# Patient Record
Sex: Female | Born: 1975 | Hispanic: Yes | Marital: Married
Health system: Midwestern US, Academic
[De-identification: ages and names within clinical notes are randomized; demographics above are authoritative.]

---

## 2021-04-29 ENCOUNTER — Encounter: Admit: 2021-04-29 | Discharge: 2021-04-29

## 2021-04-29 ENCOUNTER — Emergency Department: Admit: 2021-04-29 | Discharge: 2021-04-29

## 2021-04-29 DIAGNOSIS — K219 Gastro-esophageal reflux disease without esophagitis: Secondary | ICD-10-CM

## 2021-04-29 DIAGNOSIS — R1084 Generalized abdominal pain: Secondary | ICD-10-CM

## 2021-04-29 LAB — COMPREHENSIVE METABOLIC PANEL
ALBUMIN: 4.4 g/dL (ref 3.5–5.0)
BLD UREA NITROGEN: 12 mg/dL (ref 7–25)
CO2: 26 MMOL/L (ref 21–30)
EGFR: >60 mL/min (ref >60)
POTASSIUM: 4.1 MMOL/L (ref 3.5–5.1)
SODIUM: 139 MMOL/L (ref 137–147)
TOTAL BILIRUBIN: 0.6 mg/dL (ref 0.3–1.2)
TOTAL PROTEIN: 7 g/dL (ref 6.0–8.0)

## 2021-04-29 LAB — CBC AND DIFF
ABSOLUTE BASO COUNT: 0 K/UL (ref 0–0.20)
ABSOLUTE EOS COUNT: 0.3 K/UL (ref 0–0.45)
ABSOLUTE MONO COUNT: 0.8 K/UL — ABNORMAL HIGH (ref 0–0.80)
ABSOLUTE NEUTROPHIL: 9 K/UL — ABNORMAL HIGH (ref 1.8–7.0)
MDW (MONOCYTE DISTRIBUTION WIDTH): 21 — ABNORMAL HIGH (ref <20.7)
WBC COUNT: 12 K/UL — ABNORMAL HIGH (ref 4.5–11.0)

## 2021-04-29 LAB — URINALYSIS MICROSCOPIC REFLEX TO CULTURE

## 2021-04-29 LAB — POC CREATININE, RAD: CREATININE, POC: 0.7 mg/dL (ref 0.4–1.00)

## 2021-04-29 LAB — URINALYSIS DIPSTICK REFLEX TO CULTURE
LEUKOCYTES: NEGATIVE % (ref 0–2)
PROTEIN,UA: NEGATIVE mg/dL (ref 8.5–10.6)
URINE BLOOD: NEGATIVE U/L — ABNORMAL LOW (ref 25–110)

## 2021-04-29 MED ORDER — LACTATED RINGERS IV SOLP
1000 mL | Freq: Once | INTRAVENOUS | 0 refills | Status: CP
Start: 2021-04-29 — End: ?
  Administered 2021-04-29: 09:00:00 1000 mL via INTRAVENOUS

## 2021-04-29 MED ORDER — IOHEXOL 350 MG IODINE/ML IV SOLN
80 mL | Freq: Once | INTRAVENOUS | 0 refills | Status: CP
Start: 2021-04-29 — End: ?
  Administered 2021-04-29: 11:00:00 80 mL via INTRAVENOUS

## 2021-04-29 MED ORDER — FENTANYL CITRATE (PF) 50 MCG/ML IJ SOLN
50 ug | Freq: Once | INTRAVENOUS | 0 refills | Status: CP
Start: 2021-04-29 — End: ?
  Administered 2021-04-29: 09:00:00 50 ug via INTRAVENOUS

## 2021-04-29 MED ORDER — ONDANSETRON HCL (PF) 4 MG/2 ML IJ SOLN
4 mg | Freq: Once | INTRAVENOUS | 0 refills | Status: CP
Start: 2021-04-29 — End: ?
  Administered 2021-04-29: 09:00:00 4 mg via INTRAVENOUS

## 2021-04-29 MED ORDER — SODIUM CHLORIDE 0.9 % IJ SOLN
50 mL | Freq: Once | INTRAVENOUS | 0 refills | Status: CP
Start: 2021-04-29 — End: ?
  Administered 2021-04-29: 11:00:00 50 mL via INTRAVENOUS

## 2021-04-29 MED ORDER — OMEPRAZOLE 20 MG PO CPDR
20 mg | ORAL_CAPSULE | Freq: Every day | ORAL | 0 refills | Status: AC
Start: 2021-04-29 — End: ?

## 2021-04-29 NOTE — ED Notes
Report received from [place], [place]. Pt is alert & oriented x4, airway is patent, breathing is unlabored, & is resting at this time.

## 2021-04-29 NOTE — ED Notes
Discharge instructions read to patient. Patient verbalizes understanding. Vitals sign stable, patient alert and oriented x4. Patient ambulates with a steady gait.

## 2021-04-29 NOTE — ED Notes
45 y.o. female presents to ED19 with cc of abdominal pain. Pt states she has been having 9/10 abdominal pain in middle of abdomen that radiates to back. Pt states since Wednesday it has become worse and is a burning sensation. Pt states she has been unable to eat or drink much due to pain. Pt has intermittently had diarrhea and vomiting but denies blood in either. Pt endorses some shortness of breath and chills.    Pt AxO x4, breathing even and unlabored on RA, skin race appropriate, pt on cardiac monitors, bed in lowest locked position, call light within reach.     All belongings remain with pt.

## 2021-04-29 NOTE — ED Provider Notes
Jill Gibson Jill Gibson is a 45 y.o. female.    Chief Complaint:  Chief Complaint   Patient presents with   ? Abdominal pain     Pt reports intermited pain for 2 weeks but recently increased on wednseday. Pt endorses  N/V and diaherria. Pt reports recent fever but is fever free in triage.        History of Present Illness:  44 year old female with no significant past medical history presents for 2 weeks of worsening abdominal pain and diarrhea.  She reports today the pain got very strong and she had nausea and vomiting.  She threw up 3 different times.  The last time she threw up she does think she noted some streaks of red blood.  She reports the abdominal pain is generalized and cannot pinpoint a specific area that is worse than others.  The pain sometimes radiates to her back.  She feels like her pain worsens after eating and then she will diarrhea.  She denies blood in the stool.  She has been taking ibuprofen for the pain.  She reports history of cholecystectomy.  She does not have a period with her IUD in place.  She denies any vaginal pain or discharge.  Denies any dysuria or frequency.  She denies fever, but reports chills when she has episodes of diarrhea.  She is not currently sexually active.  Denies recent travel.        Review of Systems:  Review of Systems   Constitutional: Positive for appetite change and chills. Negative for fever.   Respiratory: Negative for shortness of breath.    Cardiovascular: Negative for chest pain and leg swelling.   Gastrointestinal: Positive for abdominal distention, abdominal pain, diarrhea, nausea and vomiting. Negative for anal bleeding and blood in stool.   Genitourinary: Negative for difficulty urinating, dysuria, menstrual problem, vaginal bleeding, vaginal discharge and vaginal pain.   All other systems reviewed and are negative.      Allergies:  Patient has no known allergies.    Past Medical History:  Medical History:   Diagnosis Date   ? Psychiatric illness Past Surgical History:  Surgical History:   Procedure Laterality Date   ? HX CHOLECYSTECTOMY         Pertinent medical/surgical history reviewed    Social History:  Social History     Tobacco Use   ? Smoking status: Never Smoker   ? Smokeless tobacco: Not on file   Substance Use Topics   ? Alcohol use: No   ? Drug use: No     Social History     Substance and Sexual Activity   Drug Use No             Family History:  No family history on file.    Vitals:  ED Vitals    Date and Time T BP P RR SPO2P SPO2 User   04/29/21 0641 -- 113/74 59 12 PER MINUTE 59 100 % TD   04/29/21 0530 -- 109/65 65 20 PER MINUTE 65 99 % TD   04/29/21 0500 -- 110/74 69 10 PER MINUTE 68 100 % TD   04/29/21 0430 -- 113/77 65 20 PER MINUTE 65 100 % TD   04/29/21 0400 -- 109/77 69 16 PER MINUTE 69 97 % TD   04/29/21 0227 -- 118/69 73 16 PER MINUTE -- 100 % KL   04/29/21 0017 36.8 ?C (98.3 ?F) 134/73 -- 16 PER MINUTE 93 100 % KW  Physical Exam:  Physical Exam  Vitals and nursing note reviewed.   Constitutional:       General: She is in acute distress.      Appearance: Normal appearance. She is normal weight.   HENT:      Head: Normocephalic and atraumatic.   Eyes:      General: No scleral icterus.     Extraocular Movements: Extraocular movements intact.      Conjunctiva/sclera: Conjunctivae normal.   Cardiovascular:      Rate and Rhythm: Normal rate and regular rhythm.   Pulmonary:      Effort: Pulmonary effort is normal.      Breath sounds: Normal breath sounds.   Abdominal:      General: Abdomen is flat. Bowel sounds are normal.      Palpations: There is no mass.      Tenderness: There is generalized abdominal tenderness. There is no guarding or rebound.      Comments: Generalized pain with palpation, although worse in the right and left upper quadrants and suprapubic region.   Musculoskeletal:         General: Normal range of motion.      Cervical back: Normal range of motion.      Right lower leg: No edema.      Left lower leg: No edema.   Skin:     General: Skin is warm and dry.      Findings: No erythema or rash.   Neurological:      General: No focal deficit present.      Mental Status: She is alert and oriented to person, place, and time.   Psychiatric:         Mood and Affect: Mood normal.         Behavior: Behavior normal.         Laboratory Results:  Labs Reviewed   CBC AND DIFF - Abnormal       Result Value Ref Range Status    White Blood Cells 12.8 (*) 4.5 - 11.0 K/UL Final    RBC 4.78  4.0 - 5.0 M/UL Final    Hemoglobin 13.9  12.0 - 15.0 GM/DL Final    Hematocrit 29.5  36 - 45 % Final    MCV 86.1  80 - 100 FL Final    MCH 29.0  26 - 34 PG Final    MCHC 33.7  32.0 - 36.0 G/DL Final    RDW 28.4  11 - 15 % Final    Platelet Count 272  150 - 400 K/UL Final    MPV 8.8  7 - 11 FL Final    Neutrophils 70  41 - 77 % Final    Lymphocytes 21 (*) 24 - 44 % Final    Monocytes 6  4 - 12 % Final    Eosinophils 3  0 - 5 % Final    Basophils 0  0 - 2 % Final    Absolute Neutrophil Count 9.02 (*) 1.8 - 7.0 K/UL Final    Absolute Lymph Count 2.63  1.0 - 4.8 K/UL Final    Absolute Monocyte Count 0.82 (*) 0 - 0.80 K/UL Final    Absolute Eosinophil Count 0.34  0 - 0.45 K/UL Final    Absolute Basophil Count 0.04  0 - 0.20 K/UL Final    MDW (Monocyte Distribution Width) 21.3 (*) <20.7 Final   COMPREHENSIVE METABOLIC PANEL - Abnormal    Sodium 139  137 -  147 MMOL/L Final    Potassium 4.1  3.5 - 5.1 MMOL/L Final    Chloride 103  98 - 110 MMOL/L Final    Glucose 109 (*) 70 - 100 MG/DL Final    Blood Urea Nitrogen 12  7 - 25 MG/DL Final    Creatinine 6.64  0.4 - 1.00 MG/DL Final    Calcium 9.2  8.5 - 10.6 MG/DL Final    Total Protein 7.0  6.0 - 8.0 G/DL Final    Total Bilirubin 0.6  0.3 - 1.2 MG/DL Final    Albumin 4.4  3.5 - 5.0 G/DL Final    Alk Phosphatase 41  25 - 110 U/L Final    AST (SGOT) 18  7 - 40 U/L Final    CO2 26  21 - 30 MMOL/L Final    ALT (SGPT) 25  7 - 56 U/L Final    Anion Gap 10  3 - 12 Final    eGFR >60  >60 mL/min Final   LIPASE    Lipase 56  11 - 82 U/L Final   URINALYSIS DIPSTICK REFLEX TO CULTURE    Color,UA YELLOW   Final    Turbidity,UA CLEAR  CLEAR-CLEAR Final    Specific Gravity-Urine 1.016  1.005 - 1.030 Final    pH,UA 6.0  5.0 - 8.0 Final    Protein,UA NEG  NEG-NEG Final    Glucose,UA NEG  NEG-NEG Final    Ketones,UA NEG  NEG-NEG Final    Bilirubin,UA NEG  NEG-NEG Final    Blood,UA NEG  NEG-NEG Final    Urobilinogen,UA NORMAL  NORM-NORMAL Final    Nitrite,UA NEG  NEG-NEG Final    Leukocytes,UA NEG  NEG-NEG Final    Urine Ascorbic Acid, UA NEG  NEG-NEG Final   URINALYSIS MICROSCOPIC REFLEX TO CULTURE    WBCs,UA 0-2  0 - 2 /HPF Final    RBCs,UA 0-2  0 - 3 /HPF Final    Comment,UA     Final    Value: Criteria for reflex to culture are WBC>10, Positive Nitrite, and/or >=+1   leukocytes. If quantity is not sufficient, an addendum will follow.      MucousUA TRACE   Final    Squamous Epithelial Cells 0-2  0 - 5 Final   POC CREATININE, RAD    Creatinine, POC 0.7  0.4 - 1.00 MG/DL Final   UA REFLEX LABEL     Urine Pregnancy  Urine Pregnancy: (S) Negative  QC: Acceptable  Urine Pregnancy Lot #: [id number]    Radiology Interpretation:    CT ABD/PELV W CONTRAST   Final Result      1.  No bowel obstruction, ascites or pneumoperitoneum. Normal appendix.   2.  Patulous, fluid and gas-filled distal esophagus, which is nonspecific and may represent reflux or GERD. If clinically indicated, an upper endoscopy could be obtained for further evaluation.   3.  Upper limits of normal in size liver.      By my electronic signature, I attest that I have personally reviewed the images for this examination and formulated the interpretations and opinions expressed in this report          Finalized by Sherolyn Buba, M.D. on 04/29/2021 6:15 AM. Dictated by Dion Body, M.D. on 04/29/2021 6:01 AM.             EKG:    Not obtained    ED Course:  Jill Gibson is a 45 y.o. female  with history as above presenting for complaint of abdominal pain, nausea and vomiting and diarrhea.  IV access was established and patient was placed on telemetry monitoring. Vital signs and physical exam as documented above.    Differential diagnosis at this time includes but is not limited to viral gastroenteritis vs pancreatitis vs appendicitis vs     Laboratory evaluation and imaging conducted as above with finding of normal CMP, CBC with mild leukocytosis of 12.8.  Normal lipase and UA without evidence of infection.    Patient given IV Zofran, IV fentanyl and 1 L LR.    On reassessment, patient with improvement of pain, currently rates about 4 or 5 out of 10.  She does not feel comfortable going home given her continued abdominal pain and would like further evaluation with imaging.  We discussed risk and benefits.  Patient expressed understanding.    CT abd/pelv w/ contrast significant for findings of fluid and gas-filled esophagus suggestive of GERD. No other acute findings. Hepatomegaly.     Overall evaluation most consistent with GERD. We discussed diet modifications, and will send patient with 2 weeks of daily PPI. Encouraged her to follow up with PCP if no improvement of her symptoms in next 1-2 weeks.     The findings as well as plan for discharge were discussed with the patient. The patient understands and agrees with plan. Patient was reassessed and has appropriate vital signs and symptom control for discharge. They were provided with discharge information, strict return precautions, and follow up instructions. Patient verbalized understanding. All questions were answered prior to discharge.       ED Scoring:    Coding    Facility Administered Meds:  Medications   lactated ringers infusion (0 mL Intravenous Infusion Stopped 04/29/21 0659)   ondansetron (ZOFRAN) injection 4 mg (4 mg Intravenous Given 04/29/21 0427)   fentaNYL citrate PF (SUBLIMAZE) injection 50 mcg (50 mcg Intravenous Given 04/29/21 0427)   iohexoL (OMNIPAQUE-350) 350 mg/mL injection 80 mL (80 mL Intravenous Given 04/29/21 0554)   sodium chloride PF 0.9% injection 50 mL (50 mL Intravenous Given 04/29/21 0554)         Clinical Impression:  Clinical Impression   Gastroesophageal reflux disease, unspecified whether esophagitis present   Abdominal pain, generalized       Disposition/Follow up  ED Disposition     ED Disposition    Discharge        No follow-up provider specified.    Medications:  Discharge Medication List as of 04/29/2021  6:37 AM      START taking these medications    Details   omeprazole DR (PRILOSEC) 20 mg capsule Take one capsule by mouth daily before breakfast., Disp-30 capsule, R-0, Normal             Procedure Notes:  Procedures      Attestation / Supervision:  Scherrie Gerlach, MD      Michelene Heady, DO

## 2021-06-26 ENCOUNTER — Encounter: Admit: 2021-06-26 | Discharge: 2021-06-26

## 2022-06-26 ENCOUNTER — Encounter: Admit: 2022-06-26 | Discharge: 2022-06-26

## 2022-10-29 ENCOUNTER — Encounter: Admit: 2022-10-29 | Discharge: 2022-10-29

## 2022-10-29 ENCOUNTER — Ambulatory Visit: Admit: 2022-10-29 | Discharge: 2022-10-30

## 2022-10-29 DIAGNOSIS — M329 Systemic lupus erythematosus, unspecified: Secondary | ICD-10-CM

## 2022-10-29 DIAGNOSIS — D229 Melanocytic nevi, unspecified: Secondary | ICD-10-CM

## 2022-10-29 DIAGNOSIS — L810 Postinflammatory hyperpigmentation: Secondary | ICD-10-CM

## 2022-10-29 DIAGNOSIS — F99 Mental disorder, not otherwise specified: Secondary | ICD-10-CM

## 2022-10-29 NOTE — Patient Instructions
Dr. Delrick Dehart's nurse, Hannah, can be reached via mychart or by calling [phone].

## 2023-01-09 ENCOUNTER — Encounter: Admit: 2023-01-09 | Discharge: 2023-01-09

## 2023-02-21 ENCOUNTER — Encounter: Admit: 2023-02-21 | Discharge: 2023-02-21

## 2023-06-10 ENCOUNTER — Encounter: Admit: 2023-06-10 | Discharge: 2023-06-10

## 2023-06-10 DIAGNOSIS — T300 Burn of unspecified body region, unspecified degree: Secondary | ICD-10-CM
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
## 2023-12-04 ENCOUNTER — Encounter: Admit: 2023-12-04 | Discharge: 2023-12-04
